# Patient Record
Sex: Male | Born: 1955 | Race: White | Hispanic: No | Marital: Single | Smoking: Former smoker
Health system: Southern US, Community
[De-identification: ages and names within clinical notes are randomized; demographics above are authoritative.]

## PROBLEM LIST (undated history)

## (undated) DIAGNOSIS — D126 Benign neoplasm of colon, unspecified: Secondary | ICD-10-CM

## (undated) HISTORY — PX: COLONOSCOPY: SHX174

## (undated) HISTORY — DX: Benign neoplasm of colon, unspecified: D12.6

---

## 2004-04-02 ENCOUNTER — Emergency Department (HOSPITAL_COMMUNITY): Admission: EM | Admit: 2004-04-02 | Discharge: 2004-04-02 | Payer: Self-pay | Admitting: *Deleted

## 2008-12-04 ENCOUNTER — Encounter
Admission: RE | Admit: 2008-12-04 | Discharge: 2008-12-04 | Payer: Self-pay | Admitting: Physical Medicine and Rehabilitation

## 2009-09-06 IMAGING — CR DG CHEST 2V
2 series · 2 of 2 positions shown · non-contrast
Comparison: None

CLINICAL DATA: Company vesicles.  Change in pulmonary function
tests.

CHEST - 2 VIEW

[view not recorded (1 of 2)]
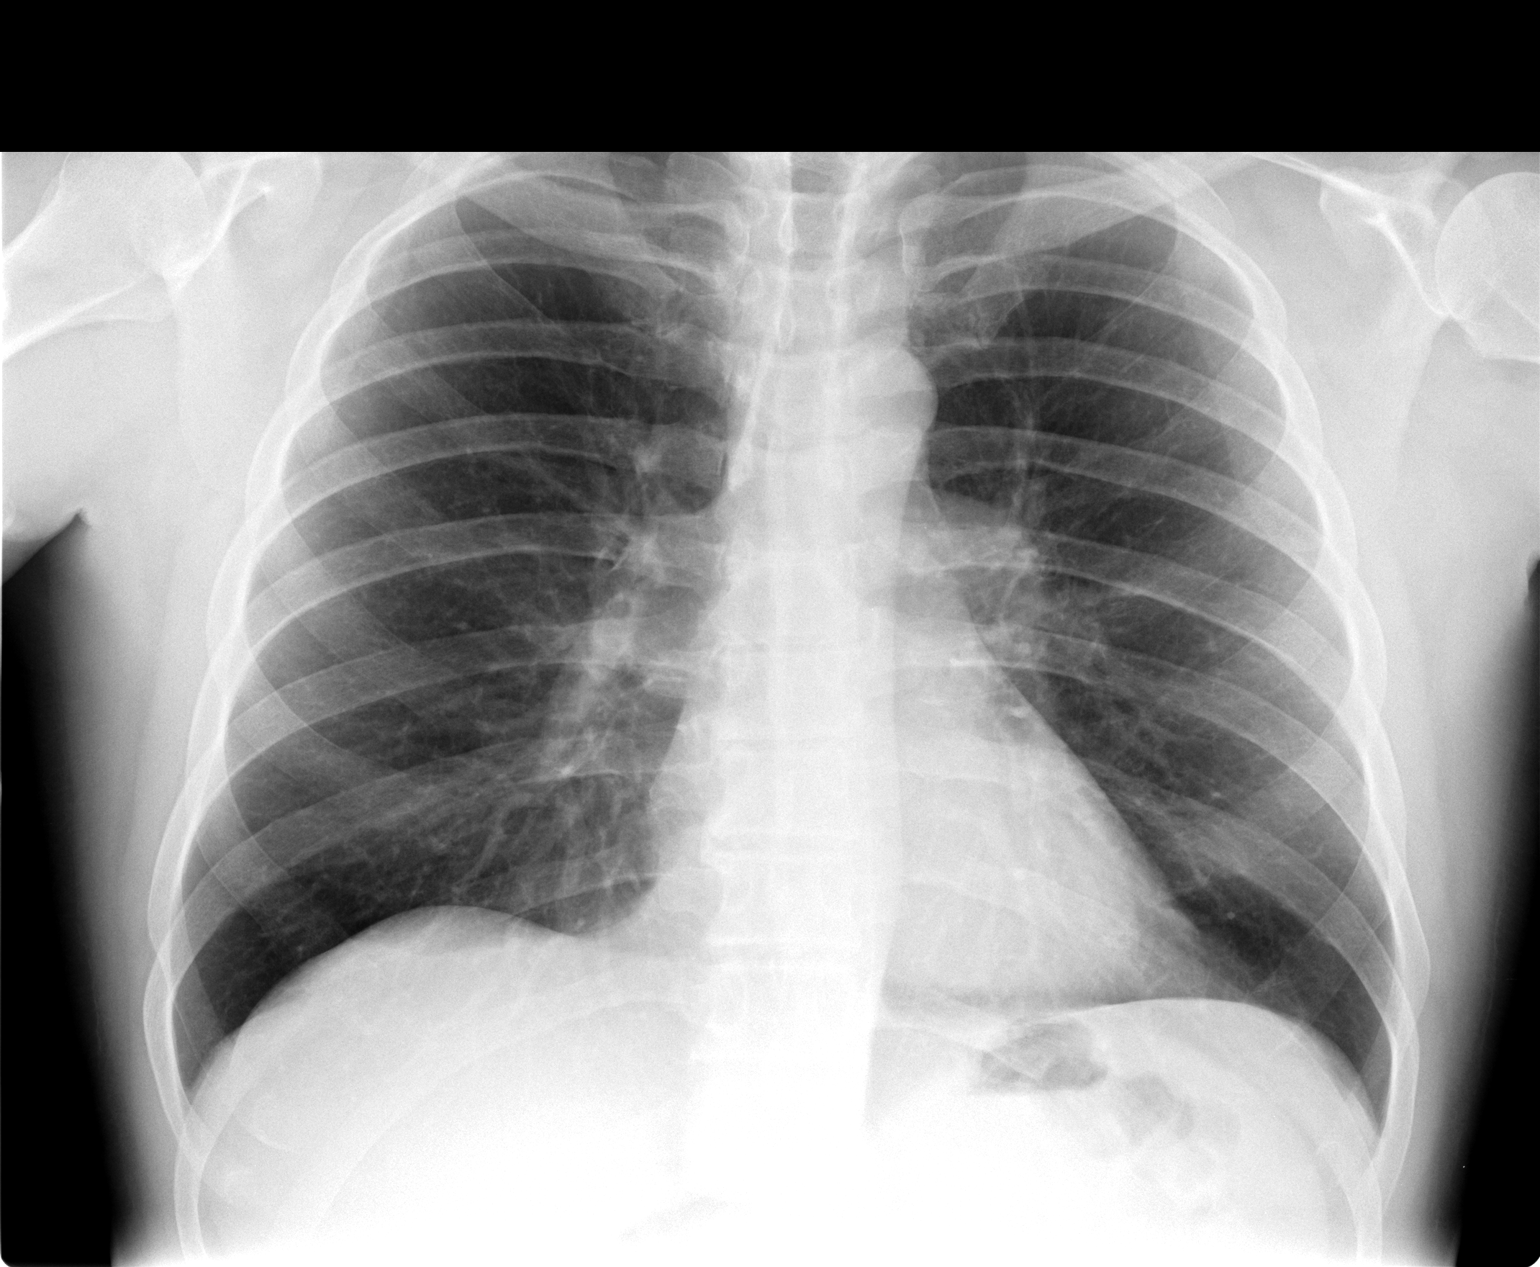

[view not recorded (2 of 2)]
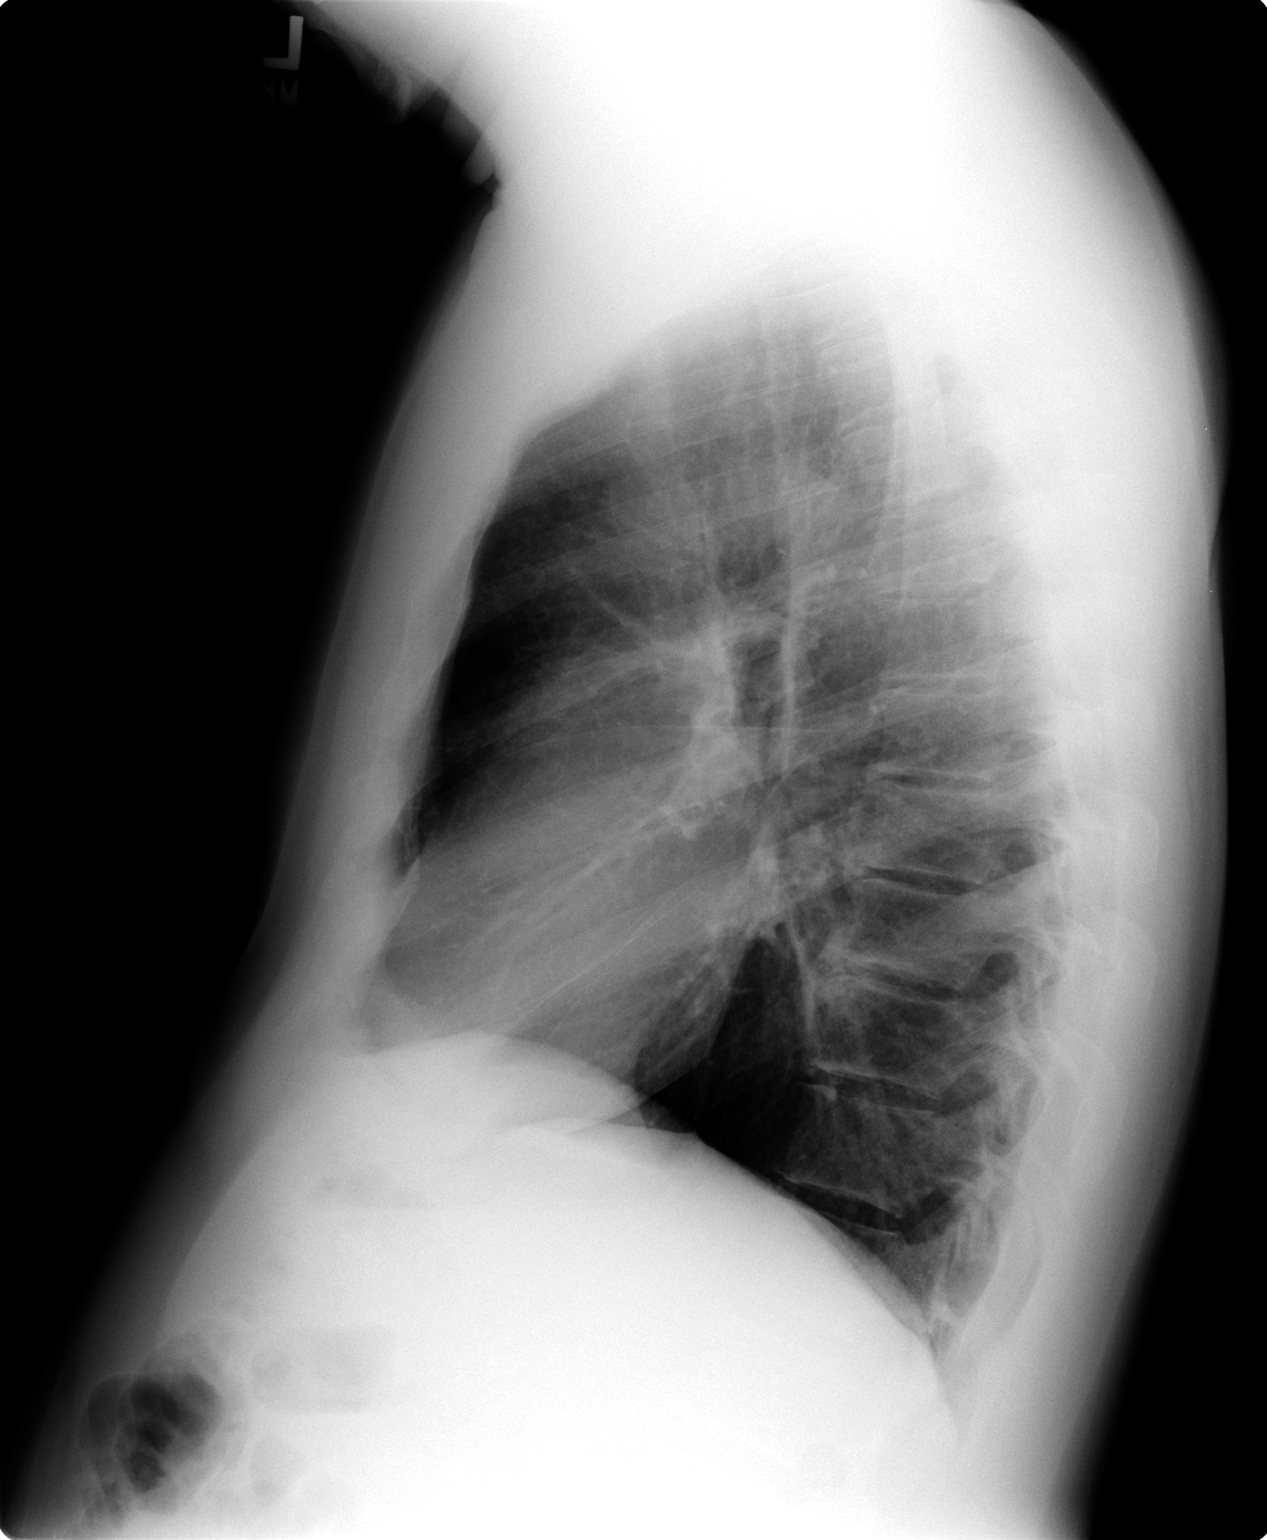

[2 of 2 positions shown; findings below may reference images not displayed]

FINDINGS: The cardiomediastinal silhouette is unremarkable.
The lungs are clear.
There is no evidence of focal airspace disease, pleural effusions,
or pneumothorax.
No acute bony abnormalities are identified.
IMPRESSION: No evidence of active cardiopulmonary disease.

REF:G3 DICTATED: 12/04/2008 [DATE]

## 2016-11-06 DIAGNOSIS — J028 Acute pharyngitis due to other specified organisms: Secondary | ICD-10-CM | POA: Diagnosis not present

## 2017-04-10 DIAGNOSIS — J189 Pneumonia, unspecified organism: Secondary | ICD-10-CM | POA: Diagnosis not present

## 2017-04-12 DIAGNOSIS — R05 Cough: Secondary | ICD-10-CM | POA: Diagnosis not present

## 2017-04-12 DIAGNOSIS — J40 Bronchitis, not specified as acute or chronic: Secondary | ICD-10-CM | POA: Diagnosis not present

## 2017-04-12 DIAGNOSIS — Z87891 Personal history of nicotine dependence: Secondary | ICD-10-CM | POA: Diagnosis not present

## 2017-04-12 DIAGNOSIS — R0602 Shortness of breath: Secondary | ICD-10-CM | POA: Diagnosis not present

## 2018-02-25 DIAGNOSIS — Z0000 Encounter for general adult medical examination without abnormal findings: Secondary | ICD-10-CM | POA: Diagnosis not present

## 2018-03-02 DIAGNOSIS — Z0000 Encounter for general adult medical examination without abnormal findings: Secondary | ICD-10-CM | POA: Diagnosis not present

## 2018-03-10 DIAGNOSIS — Z1211 Encounter for screening for malignant neoplasm of colon: Secondary | ICD-10-CM | POA: Diagnosis not present

## 2018-03-10 DIAGNOSIS — Z1212 Encounter for screening for malignant neoplasm of rectum: Secondary | ICD-10-CM | POA: Diagnosis not present

## 2018-03-24 DIAGNOSIS — R509 Fever, unspecified: Secondary | ICD-10-CM | POA: Diagnosis not present

## 2018-03-24 DIAGNOSIS — R05 Cough: Secondary | ICD-10-CM | POA: Diagnosis not present

## 2018-04-19 DIAGNOSIS — R195 Other fecal abnormalities: Secondary | ICD-10-CM | POA: Diagnosis not present

## 2018-05-27 DIAGNOSIS — R195 Other fecal abnormalities: Secondary | ICD-10-CM | POA: Diagnosis not present

## 2018-05-27 DIAGNOSIS — Z87442 Personal history of urinary calculi: Secondary | ICD-10-CM | POA: Diagnosis not present

## 2018-05-27 DIAGNOSIS — K635 Polyp of colon: Secondary | ICD-10-CM | POA: Diagnosis not present

## 2018-05-27 DIAGNOSIS — D126 Benign neoplasm of colon, unspecified: Secondary | ICD-10-CM | POA: Diagnosis not present

## 2018-06-14 DIAGNOSIS — D126 Benign neoplasm of colon, unspecified: Secondary | ICD-10-CM | POA: Diagnosis not present

## 2022-03-26 LAB — LIPID PANEL
LDL Cholesterol: 77
Triglycerides: 132 (ref 40–160)

## 2022-03-26 LAB — TSH: TSH: 46.1 — AB (ref 0.41–5.90)

## 2022-03-26 LAB — HEMOGLOBIN A1C: Hemoglobin A1C: 5.8

## 2022-03-26 LAB — VITAMIN D 25 HYDROXY (VIT D DEFICIENCY, FRACTURES): Vit D, 25-Hydroxy: 27

## 2022-05-13 ENCOUNTER — Encounter: Payer: Self-pay | Admitting: Nurse Practitioner

## 2022-05-13 ENCOUNTER — Ambulatory Visit: Payer: 59 | Admitting: Nurse Practitioner

## 2022-05-13 VITALS — BP 117/77 | HR 69 | Ht 66.0 in | Wt 177.0 lb

## 2022-05-13 DIAGNOSIS — R7989 Other specified abnormal findings of blood chemistry: Secondary | ICD-10-CM | POA: Diagnosis not present

## 2022-05-13 DIAGNOSIS — E041 Nontoxic single thyroid nodule: Secondary | ICD-10-CM | POA: Diagnosis not present

## 2022-05-13 DIAGNOSIS — E039 Hypothyroidism, unspecified: Secondary | ICD-10-CM

## 2022-05-13 NOTE — Patient Instructions (Signed)
Thyroid-Stimulating Hormone Test Why am I having this test? The thyroid is a gland in the lower front of the neck. It makes hormones that affect many body parts and systems, including the system that affects how quickly the body burns fuel for energy (metabolism). The pituitary gland is located just below the brain, behind the eyes and nasal passages. It helps maintain thyroid hormone levels and thyroid gland function. You may have a thyroid-stimulating hormone (TSH) test if you have possible symptoms of abnormal thyroid hormone levels. This test can help your health care provider: Diagnose a disorder of the thyroid gland or pituitary gland. Manage your condition and treatment if you have an underactive thyroid (hypothyroidism) or an overactive thyroid (hyperthyroidism). Newborn babies may have this test done to screen for hypothyroidism that is present at birth (congenital). What is being tested? This test measures the amount of TSH in your blood. TSH may also be called thyrotropin. When the thyroid does not make enough hormones, the pituitary gland releases TSH into the bloodstream to stimulate the thyroid gland to make more hormones. What kind of sample is taken?     A blood sample is required for this test. It is usually collected by inserting a needle into a blood vessel. For newborns, a small amount of blood may be collected from the umbilical cord, or by using a small needle to prick the baby's heel (heel stick). Tell a health care provider about: All medicines you are taking, including vitamins, herbs, eye drops, creams, and over-the-counter medicines. Any bleeding problems you have. Any surgeries you have had. Any medical conditions you have. Whether you are pregnant or may be pregnant. How are the results reported? Your test results will be reported as a value that indicates how much TSH is in your blood. Your health care provider will compare your results to normal ranges that were  established after testing a large group of people (reference ranges). Reference ranges may vary among labs and hospitals. For this test, common reference ranges are: Adult: 2-10 microunits/mL or 2-10 milliunits/L. Newborn: Heel stick: 3-18 microunits/mL or 3-18 milliunits/L. Umbilical cord: 3-12 microunits/mL or 3-12 milliunits/L. What do the results mean? Results that are within the reference range are considered normal. This means that you have a normal amount of TSH in your blood. Results that are higher than the reference range mean that your TSH levels are too high. This may mean: Your thyroid gland is not making enough thyroid hormones. Your thyroid medicine dosage is too low. You have a tumor on your pituitary gland. This is rare. Results that are lower than the reference range mean that your TSH levels are too low. This may be caused by hyperthyroidism or by a problem with the pituitary gland function. Talk with your health care provider about what your results mean. Questions to ask your health care provider Ask your health care provider, or the department that is doing the test: When will my results be ready? How will I get my results? What are my treatment options? What other tests do I need? What are my next steps? Summary You may have a thyroid-stimulating hormone (TSH) test if you have possible symptoms of abnormal thyroid hormone levels. The thyroid is a gland in the lower front of the neck. It makes hormones that affect many body parts and systems. The pituitary gland is located just below the brain, behind the eyes and nasal passages. It helps maintain thyroid hormone levels and thyroid gland function. This test   measures the amount of TSH in your blood. TSH is made by the pituitary gland. It may also be called thyrotropin. This information is not intended to replace advice given to you by your health care provider. Make sure you discuss any questions you have with your  health care provider. Document Revised: 10/15/2021 Document Reviewed: 10/15/2021 Elsevier Patient Education  2023 Elsevier Inc.  

## 2022-05-13 NOTE — Progress Notes (Signed)
Endocrinology Consult Note                                         05/13/2022, 4:19 PM  Subjective:   Subjective    Lonnie Wade is a 66 y.o.-year-old male patient being seen in consultation for hypothyroidism referred by [place], MD.   Past Medical History:  Diagnosis Date   Tubular adenoma of colon     Past Surgical History:  Procedure Laterality Date   COLONOSCOPY      Social History   Socioeconomic History   Marital status: Single    Spouse name: Not on file   Number of children: Not on file   Years of education: Not on file   Highest education level: Not on file  Occupational History   Not on file  Tobacco Use   Smoking status: Former    Types: Cigarettes    Quit date: 07/27/2009    Years since quitting: 12.8   Smokeless tobacco: Never  Vaping Use   Vaping Use: Never used  Substance and Sexual Activity   Alcohol use: Never   Drug use: Never   Sexual activity: Not on file  Other Topics Concern   Not on file  Social History Narrative   Not on file   Social Determinants of Health   Financial Resource Strain: Not on file  Food Insecurity: Not on file  Transportation Needs: Not on file  Physical Activity: Not on file  Stress: Not on file  Social Connections: Not on file    Family History  Problem Relation Age of Onset   Hypertension Father    Diabetes Father     Outpatient Encounter Medications as of 05/13/2022  Medication Sig   Cholecalciferol (VITAMIN D3) 25 MCG (1000 UT) CAPS Take by mouth daily.   levothyroxine (SYNTHROID) 75 MCG tablet Take 75 mcg by mouth daily.   rosuvastatin (CRESTOR) 10 MG tablet Take 10 mg by mouth daily.   No facility-administered encounter medications on file as of 05/13/2022.    ALLERGIES: No Known Allergies VACCINATION STATUS:  There is no immunization history on file for this patient.   HPI   Lonnie Wade  is a patient with the  above medical history. he was diagnosed  with hypothyroidism at approximate age of 10 years (recent diagnosis after routine labs showed abnormal TSH), which required subsequent initiation of thyroid hormone replacement. he was given various doses of Levothyroxine, currently on 75 micrograms. he reports compliance to this medication:  Taking it daily on empty stomach with water, separated by >30 minutes before breakfast and other medications, and by at least 4 hours from calcium, iron, PPIs, multivitamins .  I reviewed patient's thyroid tests:  Lab Results  Component Value Date   TSH 46.10 (A) 03/26/2022     Pt describes: - cold intolerance  Pt denies feeling nodules in neck, hoarseness, dysphagia/odynophagia, SOB with lying down.  he does have family history of thyroid disorders in his daughter and  wife.  No family history of thyroid cancer. No history of radiation therapy to head or neck.  No recent use of iodine supplements.  Denies use of Biotin containing supplements.  He had thyroid US on 04/10/22 which showed 5m nodule in left thyroid lobe, not suspicious for malignancy, not recommending follow up.   Review of systems  Constitutional: + Minimally fluctuating body weight,  current Body mass index is 28.57 kg/m. , no fatigue, no subjective hyperthermia, no subjective hypothermia Eyes: no blurry vision, no xerophthalmia ENT: no sore throat, no nodules palpated in throat, no dysphagia/odynophagia, no hoarseness Cardiovascular: no chest pain, no shortness of breath, no palpitations, no leg swelling Respiratory: no cough, no shortness of breath Gastrointestinal: no nausea/vomiting/diarrhea Musculoskeletal: no muscle/joint aches Skin: no rashes, no hyperemia Neurological: no tremors, no numbness, no tingling, no dizziness Psychiatric: no depression, no anxiety   Objective:   Objective     BP 117/77   Pulse 69   Ht '5\' 6"'$  (1.676 m)   Wt 177 lb (80.3 kg)   BMI 28.57 kg/m  Wt  Readings from Last 3 Encounters:  05/13/22 177 lb (80.3 kg)    BP Readings from Last 3 Encounters:  05/13/22 117/77     Constitutional:  Body mass index is 28.57 kg/m., not in acute distress, normal state of mind Eyes: PERRLA, EOMI, no exophthalmos ENT: moist mucous membranes, no thyromegaly, no cervical lymphadenopathy Cardiovascular: normal precordial activity, RRR, no murmur/rubs/gallops Respiratory:  adequate breathing efforts, no gross chest deformity, Clear to auscultation bilaterally Gastrointestinal: abdomen soft, non-tender, no distension, bowel sounds present Musculoskeletal: no gross deformities, strength intact in all four extremities Skin: moist, warm, no rashes Neurological: no tremor with outstretched hands, deep tendon reflexes normal in BLE.   CMP ( most recent) CMP  No results found for: "NA", "K", "CL", "CO2", "GLUCOSE", "BUN", "CREATININE", "CALCIUM", "PROT", "ALBUMIN", "AST", "ALT", "ALKPHOS", "BILITOT", "GFRNONAA", "GFRAA"   Diabetic Labs (most recent): Lab Results  Component Value Date   HGBA1C 5.8 03/26/2022     Lipid Panel ( most recent) Lipid Panel     Component Value Date/Time   TRIG 132 03/26/2022 0000   LDLCALC 77 03/26/2022 0000       Lab Results  Component Value Date   TSH 46.10 (A) 03/26/2022    Thyroid [facility] 04/10/22 Clinical history: elevated TSH  Findings: Right lobe: measures 6 x 2.1 x 2.1 cm.  Heterogeneous echotexture. Normal flow.  No nodules. Left lobe: Measures 5.9 x 2.7 x 3 cm.  Heterogeneous echotexture.  Normal Flow.  5 x 6 x 4 mm hyperechoic nodule TR3. Isthmus: 483m no nodules No adenopathy Comparison: None  Impression:  6 mm left lobe nodule, TR3, no follow up needed No other nodules.  Assessment & Plan:   ASSESSMENT / PLAN:  1. Hypothyroidism-unspecified   Patient with relatively new hypothyroidism, on levothyroxine therapy. On physical exam , patient  does not have gross goiter, thyroid nodules, or neck  compression symptoms. He is advised to continue his current dose of Levothyroxine 75 mcg po daily before breakfast (was recently increased to this about a week ago).   - We discussed about correct intake of levothyroxine, at fasting, with water, separated by at least 30 minutes from breakfast, and separated by more than 4 hours from calcium, iron, multivitamins, acid reflux medications (PPIs). -Patient is made aware of the fact that thyroid hormone replacement is needed for life, dose to be adjusted by periodic monitoring of thyroid function tests.  -  Will check thyroid tests before next visit: TSH, free T4 and antibody testing to help classify his dysfunction.    - Time spent with the patient: 45 minutes, of which >50% was spent in obtaining information about his symptoms, reviewing his previous labs, evaluations, and treatments, counseling him about his hypothyroidism, and developing a plan to confirm the diagnosis and long term treatment as necessary. Please refer to "Patient Self Inventory" in the Media tab for reviewed elements of pertinent patient history.  Mercy Riding Leija participated in the discussions, expressed understanding, and voiced agreement with the above plans.  All questions were answered to his satisfaction. he is encouraged to contact clinic should he have any questions or concerns prior to his return visit.   FOLLOW UP PLAN:  Return in about 6 weeks (around 06/24/2022) for Thyroid follow up, Previsit labs.  Rayetta Pigg, [facility] [address] [place], [place] [address] Phone: [phone] Fax: [phone]  05/13/2022, 4:19 PM

## 2022-07-02 LAB — TSH: TSH: 4.17 (ref 0.41–5.90)

## 2022-07-07 ENCOUNTER — Ambulatory Visit: Payer: 59 | Admitting: Nurse Practitioner

## 2022-07-07 ENCOUNTER — Encounter: Payer: Self-pay | Admitting: Nurse Practitioner

## 2022-07-07 VITALS — BP 112/72 | HR 83 | Ht 66.0 in | Wt 172.2 lb

## 2022-07-07 DIAGNOSIS — R7989 Other specified abnormal findings of blood chemistry: Secondary | ICD-10-CM | POA: Diagnosis not present

## 2022-07-07 DIAGNOSIS — E039 Hypothyroidism, unspecified: Secondary | ICD-10-CM | POA: Diagnosis not present

## 2022-07-07 MED ORDER — LEVOTHYROXINE SODIUM 88 MCG PO TABS: 88.0000 ug | ORAL_TABLET | Freq: Every day | ORAL | 1 refills | Status: DC

## 2022-07-07 NOTE — Patient Instructions (Signed)

## 2022-07-07 NOTE — Progress Notes (Signed)
Endocrinology Follow Up Note                                         07/07/2022, 2:40 PM  Subjective:   Subjective    Lonnie Wade is a 66 y.o.-year-old male patient being seen in follow up after being seen in consultation for hypothyroidism referred by [place], MD.   Past Medical History:  Diagnosis Date   Tubular adenoma of colon     Past Surgical History:  Procedure Laterality Date   COLONOSCOPY      Social History   Socioeconomic History   Marital status: Single    Spouse name: Not on file   Number of children: Not on file   Years of education: Not on file   Highest education level: Not on file  Occupational History   Not on file  Tobacco Use   Smoking status: Former    Types: Cigarettes    Quit date: 07/27/2009    Years since quitting: 12.9   Smokeless tobacco: Never  Vaping Use   Vaping Use: Never used  Substance and Sexual Activity   Alcohol use: Never   Drug use: Never   Sexual activity: Not on file  Other Topics Concern   Not on file  Social History Narrative   Not on file   Social Determinants of Health   Financial Resource Strain: Not on file  Food Insecurity: Not on file  Transportation Needs: Not on file  Physical Activity: Not on file  Stress: Not on file  Social Connections: Not on file    Family History  Problem Relation Age of Onset   Hypertension Father    Diabetes Father     Outpatient Encounter Medications as of 07/07/2022  Medication Sig   Cholecalciferol (VITAMIN D3) 25 MCG (1000 UT) CAPS Take by mouth daily.   rosuvastatin (CRESTOR) 10 MG tablet Take 10 mg by mouth daily.   [DISCONTINUED] levothyroxine (SYNTHROID) 75 MCG tablet Take 88 mcg by mouth daily.   levothyroxine (SYNTHROID) 88 MCG tablet Take 1 tablet (88 mcg total) by mouth daily before breakfast.   No facility-administered encounter medications on file as of 07/07/2022.     ALLERGIES: No Known Allergies VACCINATION STATUS:  There is no immunization history on file for this patient.   HPI   Lonnie Wade  is a patient with the above medical history. he was diagnosed  with hypothyroidism at approximate age of 66 years (recent diagnosis after routine labs showed abnormal TSH), which required subsequent initiation of thyroid hormone replacement. he was given various doses of Levothyroxine, currently on 75 micrograms. he reports compliance to this medication:  Taking it daily on empty stomach with water, separated by >30 minutes before breakfast and other medications, and by at least 4 hours from calcium, iron, PPIs, multivitamins .  I reviewed patient's thyroid tests:  Lab Results  Component Value Date   TSH 4.17 07/02/2022   TSH 46.10 (A) 03/26/2022  Pt denies feeling nodules in neck, hoarseness, dysphagia/odynophagia, SOB with lying down.  he does have family history of thyroid disorders in his daughter and wife.  No family history of thyroid cancer. No history of radiation therapy to head or neck.  No recent use of iodine supplements.  Denies use of Biotin containing supplements.  He had thyroid US on 04/10/22 which showed 32m nodule in left thyroid lobe, not suspicious for malignancy, not recommending follow up.   Review of systems  Constitutional: + Minimally fluctuating body weight,  current Body mass index is 27.79 kg/m. , no fatigue, no subjective hyperthermia, no subjective hypothermia Eyes: no blurry vision, no xerophthalmia ENT: no sore throat, no nodules palpated in throat, no dysphagia/odynophagia, no hoarseness Cardiovascular: no chest pain, no shortness of breath, no palpitations, no leg swelling Respiratory: no cough, no shortness of breath Gastrointestinal: no nausea/vomiting/diarrhea Musculoskeletal: no muscle/joint aches Skin: no rashes, no hyperemia Neurological: no tremors, no numbness, no tingling, no dizziness Psychiatric:  no depression, no anxiety   Objective:   Objective     BP 112/72 (BP Location: Left Arm, Patient Position: Sitting, Cuff Size: Normal)   Pulse 83   Ht '5\' 6"'$  (1.676 m)   Wt 172 lb 3.2 oz (78.1 kg)   BMI 27.79 kg/m  Wt Readings from Last 3 Encounters:  07/07/22 172 lb 3.2 oz (78.1 kg)  05/13/22 177 lb (80.3 kg)    BP Readings from Last 3 Encounters:  07/07/22 112/72  05/13/22 117/77      Physical Exam- Limited  Constitutional:  Body mass index is 27.79 kg/m. , not in acute distress, normal state of mind Eyes:  EOMI, no exophthalmos Neck: Supple Cardiovascular: RRR, no murmurs, rubs, or gallops, no edema Respiratory: Adequate breathing efforts, no crackles, rales, rhonchi, or wheezing Musculoskeletal: no gross deformities, strength intact in all four extremities, no gross restriction of joint movements Skin:  no rashes, no hyperemia Neurological: no tremor with outstretched hands   CMP ( most recent) CMP  No results found for: "NA", "K", "CL", "CO2", "GLUCOSE", "BUN", "CREATININE", "CALCIUM", "PROT", "ALBUMIN", "AST", "ALT", "ALKPHOS", "BILITOT", "GFRNONAA", "GFRAA"   Diabetic Labs (most recent): Lab Results  Component Value Date   HGBA1C 5.8 03/26/2022     Lipid Panel ( most recent) Lipid Panel     Component Value Date/Time   TRIG 132 03/26/2022 0000   LDLCALC 77 03/26/2022 0000       Lab Results  Component Value Date   TSH 4.17 07/02/2022   TSH 46.10 (A) 03/26/2022    Thyroid [facility] 04/10/22 Clinical history: elevated TSH  Findings: Right lobe: measures 6 x 2.1 x 2.1 cm.  Heterogeneous echotexture. Normal flow.  No nodules. Left lobe: Measures 5.9 x 2.7 x 3 cm.  Heterogeneous echotexture.  Normal Flow.  5 x 6 x 4 mm hyperechoic nodule TR3. Isthmus: 460m no nodules No adenopathy Comparison: None  Impression:  6 mm left lobe nodule, TR3, no follow up needed No other nodules.  Assessment & Plan:   ASSESSMENT / PLAN:  1.  Hypothyroidism-unspecified   Patient with relatively new hypothyroidism, on levothyroxine therapy. On physical exam , patient  does not have gross goiter, thyroid nodules, or neck compression symptoms.  His previsit thyroid function tests are consistent with slight under-replacement, much better than before.  He is advised to increase his Levothyroxine to 88 mcg po daily before breakfast.  - We discussed about correct intake of levothyroxine, at fasting, with water, separated by at  least 30 minutes from breakfast, and separated by more than 4 hours from calcium, iron, multivitamins, acid reflux medications (PPIs). -Patient is made aware of the fact that thyroid hormone replacement is needed for life, dose to be adjusted by periodic monitoring of thyroid function tests.  - Will check thyroid tests before next visit: TSH, free T4 and antibody testing to help classify his dysfunction (this was not performed with last blood draw).     I spent 20 minutes in the care of the patient today including review of labs from Thyroid Function, CMP, and other relevant labs ; imaging/biopsy records (current and previous including abstractions from other facilities); face-to-face time discussing  his lab results and symptoms, medications doses, his options of short and long term treatment based on the latest standards of care / guidelines;   and documenting the encounter.  Mercy Riding Mackintosh  participated in the discussions, expressed understanding, and voiced agreement with the above plans.  All questions were answered to his satisfaction. he is encouraged to contact clinic should he have any questions or concerns prior to his return visit.   FOLLOW UP PLAN:  Return in about 3 months (around 10/06/2022) for Thyroid follow up, Previsit labs.  Rayetta Pigg, [facility] [address] [place], [place] [address] Phone: [phone] Fax: [phone]  07/07/2022, 2:40 PM

## 2022-10-07 ENCOUNTER — Ambulatory Visit: Payer: 59 | Admitting: Nurse Practitioner

## 2022-10-07 ENCOUNTER — Encounter: Payer: Self-pay | Admitting: Nurse Practitioner

## 2022-10-07 VITALS — BP 101/66 | HR 60 | Ht 66.0 in | Wt 173.0 lb

## 2022-10-07 DIAGNOSIS — E041 Nontoxic single thyroid nodule: Secondary | ICD-10-CM

## 2022-10-07 DIAGNOSIS — E038 Other specified hypothyroidism: Secondary | ICD-10-CM | POA: Diagnosis not present

## 2022-10-07 DIAGNOSIS — E063 Autoimmune thyroiditis: Secondary | ICD-10-CM

## 2022-10-07 MED ORDER — LEVOTHYROXINE SODIUM 88 MCG PO TABS: 88.0000 ug | ORAL_TABLET | Freq: Every day | ORAL | 1 refills | Status: DC

## 2022-10-07 NOTE — Progress Notes (Signed)
Endocrinology Follow Up Note                                         10/07/2022, 8:25 AM  Subjective:   Subjective    Lonnie Wade is a 66 y.o.-year-old male patient being seen in follow up after being seen in consultation for hypothyroidism referred by Baxter Nation, MD.   Past Medical History:  Diagnosis Date   Tubular adenoma of colon     Past Surgical History:  Procedure Laterality Date   COLONOSCOPY      Social History   Socioeconomic History   Marital status: Single    Spouse name: Not on file   Number of children: Not on file   Years of education: Not on file   Highest education level: Not on file  Occupational History   Not on file  Tobacco Use   Smoking status: Former    Types: Cigarettes    Quit date: 07/27/2009    Years since quitting: 13.2   Smokeless tobacco: Never  Vaping Use   Vaping Use: Never used  Substance and Sexual Activity   Alcohol use: Never   Drug use: Never   Sexual activity: Not on file  Other Topics Concern   Not on file  Social History Narrative   Not on file   Social Determinants of Health   Financial Resource Strain: Not on file  Food Insecurity: Not on file  Transportation Needs: Not on file  Physical Activity: Not on file  Stress: Not on file  Social Connections: Not on file    Family History  Problem Relation Age of Onset   Hypertension Father    Diabetes Father     Outpatient Encounter Medications as of 10/07/2022  Medication Sig   Cholecalciferol (VITAMIN D3) 25 MCG (1000 UT) CAPS Take by mouth daily.   rosuvastatin (CRESTOR) 10 MG tablet Take 10 mg by mouth daily.   [DISCONTINUED] levothyroxine (SYNTHROID) 88 MCG tablet Take 1 tablet (88 mcg total) by mouth daily before breakfast.   levothyroxine (SYNTHROID) 88 MCG tablet Take 1 tablet (88 mcg total) by mouth daily before breakfast.   No facility-administered encounter medications on file  as of 10/07/2022.    ALLERGIES: No Known Allergies VACCINATION STATUS:  There is no immunization history on file for this patient.   HPI   Lonnie Wade  is a patient with the above medical history. he was diagnosed  with hypothyroidism at approximate age of 57 years (recent diagnosis after routine labs showed abnormal TSH), which required subsequent initiation of thyroid hormone replacement. he was given various doses of Levothyroxine, currently on 88 micrograms. he reports compliance to this medication:  Taking it daily on empty stomach with water, separated by >30 minutes before breakfast and other medications, and by at least 4 hours from calcium, iron, PPIs, multivitamins .  I reviewed patient's thyroid tests:  Lab Results  Component Value Date   TSH 4.17 07/02/2022   TSH 46.10 (A)  03/26/2022     Pt denies feeling nodules in neck, hoarseness, dysphagia/odynophagia, SOB with lying down.  he does have family history of thyroid disorders in his daughter and wife.  No family history of thyroid cancer. No history of radiation therapy to head or neck.  No recent use of iodine supplements.  Denies use of Biotin containing supplements.  He had thyroid US on 04/10/22 which showed 44m nodule in left thyroid lobe, not suspicious for malignancy, not recommending follow up.   Review of systems  Constitutional: + Minimally fluctuating body weight,  current Body mass index is 27.92 kg/m. , no fatigue, no subjective hyperthermia, no subjective hypothermia Eyes: no blurry vision, no xerophthalmia ENT: no sore throat, no nodules palpated in throat, no dysphagia/odynophagia, no hoarseness Cardiovascular: no chest pain, no shortness of breath, no palpitations, no leg swelling Respiratory: no cough, no shortness of breath Gastrointestinal: no nausea/vomiting/diarrhea Musculoskeletal: no muscle/joint aches Skin: no rashes, no hyperemia Neurological: no tremors, no numbness, no tingling, no  dizziness Psychiatric: no depression, no anxiety   Objective:   Objective     BP 101/66 (BP Location: Left Arm, Patient Position: Sitting, Cuff Size: Normal)   Pulse 60   Ht '5\' 6"'$  (1.676 m)   Wt 173 lb (78.5 kg)   BMI 27.92 kg/m  Wt Readings from Last 3 Encounters:  10/07/22 173 lb (78.5 kg)  07/07/22 172 lb 3.2 oz (78.1 kg)  05/13/22 177 lb (80.3 kg)    BP Readings from Last 3 Encounters:  10/07/22 101/66  07/07/22 112/72  05/13/22 117/77      Physical Exam- Limited  Constitutional:  Body mass index is 27.92 kg/m. , not in acute distress, normal state of mind Eyes:  EOMI, no exophthalmos Musculoskeletal: no gross deformities, strength intact in all four extremities, no gross restriction of joint movements Skin:  no rashes, no hyperemia Neurological: no tremor with outstretched hands   CMP ( most recent) CMP  No results found for: "NA", "K", "CL", "CO2", "GLUCOSE", "BUN", "CREATININE", "CALCIUM", "PROT", "ALBUMIN", "AST", "ALT", "ALKPHOS", "BILITOT", "GFRNONAA", "GFRAA"   Diabetic Labs (most recent): Lab Results  Component Value Date   HGBA1C 5.8 03/26/2022     Lipid Panel ( most recent) Lipid Panel     Component Value Date/Time   TRIG 132 03/26/2022 0000   LDLCALC 77 03/26/2022 0000       Lab Results  Component Value Date   TSH 4.17 07/02/2022   TSH 46.10 (A) 03/26/2022    Thyroid [facility] 04/10/22 Clinical history: elevated TSH  Findings: Right lobe: measures 6 x 2.1 x 2.1 cm.  Heterogeneous echotexture. Normal flow.  No nodules. Left lobe: Measures 5.9 x 2.7 x 3 cm.  Heterogeneous echotexture.  Normal Flow.  5 x 6 x 4 mm hyperechoic nodule TR3. Isthmus: 455m no nodules No adenopathy Comparison: None  Impression:  6 mm left lobe nodule, TR3, no follow up needed No other nodules.    Assessment & Plan:   ASSESSMENT / PLAN:  1. Hypothyroidism-r/t Hashimotos thyroiditis   Patient with relatively new hypothyroidism, on levothyroxine  therapy. 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  His previsit TFTs are consistent with appropriate hormone replacement.  He is advised to continue his Levothyroxine 88 mcg po daily before breakfast.  - We discussed about correct intake of levothyroxine, at fasting, with water, separated by at least 30 minutes from breakfast, and separated by more than 4 hours from calcium, iron,  multivitamins, acid reflux medications (PPIs). -Patient is made aware of the fact that thyroid hormone replacement is needed for life, dose to be adjusted by periodic monitoring of thyroid function tests.     I spent 20 minutes in the care of the patient today including review of labs from Thyroid Function, CMP, and other relevant labs ; imaging/biopsy records (current and previous including abstractions from other facilities); face-to-face time discussing  his lab results and symptoms, medications doses, his options of short and long term treatment based on the latest standards of care / guidelines;   and documenting the encounter.  Mercy Riding Sikora  participated in the discussions, expressed understanding, and voiced agreement with the above plans.  All questions were answered to his satisfaction. he is encouraged to contact clinic should he have any questions or concerns prior to his return visit.   FOLLOW UP PLAN:  Return in about 4 months (around 02/06/2023) for Thyroid follow up, Previsit labs.  Rayetta Pigg, [facility] [address] [place], [place] [address] Phone: [phone] Fax: [phone]  10/07/2022, 8:25 AM

## 2022-10-30 ENCOUNTER — Ambulatory Visit: Payer: Medicare Other | Admitting: Urology

## 2022-10-30 ENCOUNTER — Encounter: Payer: Self-pay | Admitting: Urology

## 2022-10-30 VITALS — BP 123/73 | HR 81

## 2022-10-30 DIAGNOSIS — R3129 Other microscopic hematuria: Secondary | ICD-10-CM

## 2022-10-30 DIAGNOSIS — Z87442 Personal history of urinary calculi: Secondary | ICD-10-CM

## 2022-10-30 DIAGNOSIS — Z125 Encounter for screening for malignant neoplasm of prostate: Secondary | ICD-10-CM | POA: Diagnosis not present

## 2022-10-30 LAB — MICROSCOPIC EXAMINATION
Bacteria, UA: NONE SEEN
Epithelial Cells (non renal): NONE SEEN /hpf (ref 0–10)
WBC, UA: NONE SEEN /hpf (ref 0–5)

## 2022-10-30 LAB — URINALYSIS, ROUTINE W REFLEX MICROSCOPIC
Bilirubin, UA: NEGATIVE
Glucose, UA: NEGATIVE
Leukocytes,UA: NEGATIVE
Nitrite, UA: NEGATIVE
Protein,UA: NEGATIVE
Specific Gravity, UA: 1.025 (ref 1.005–1.030)
Urobilinogen, Ur: 0.2 mg/dL (ref 0.2–1.0)
pH, UA: 5 (ref 5.0–7.5)

## 2022-10-30 NOTE — Progress Notes (Signed)
Subjective: 1. Microhematuria   2. History of nephrolithiasis   3. Screening for prostate cancer      Consult requested by Dr. Wynelle Link is a 67 yo WM who is sent for microhematuria on UA's over the last 3-4 months.  He has not had any gross hematuria or prior issues.  He had a stone on 12/10/2013 that he passed.  He has had rare UTI's but no GU surgery.  He has some intermittent left lower rib pain that is associated with sitting.   He has some dysuria 1-2x a months.  He was treated for a UTI and yeast infection.  He is circumcised.   He has no nocturia, urgency or frequency.  He can have a split stream at times.  UA today has 2+ blood.  ROS:  Review of Systems  All other systems reviewed and are negative.   No Known Allergies  Past Medical History:  Diagnosis Date   Tubular adenoma of colon     Past Surgical History:  Procedure Laterality Date   COLONOSCOPY      Social History   Socioeconomic History   Marital status: Single    Spouse name: Not on file   Number of children: Not on file   Years of education: Not on file   Highest education level: Not on file  Occupational History   Not on file  Tobacco Use   Smoking status: Former    Types: Cigarettes    Quit date: 07/27/2009    Years since quitting: 13.2   Smokeless tobacco: Never  Vaping Use   Vaping Use: Never used  Substance and Sexual Activity   Alcohol use: Never   Drug use: Never   Sexual activity: Not on file  Other Topics Concern   Not on file  Social History Narrative   Not on file   Social Determinants of Health   Financial Resource Strain: Not on file  Food Insecurity: Not on file  Transportation Needs: Not on file  Physical Activity: Not on file  Stress: Not on file  Social Connections: Not on file  Intimate Partner Violence: Not on file    Family History  Problem Relation Age of Onset   Hypertension Father    Diabetes Father     Anti-infectives: Anti-infectives (From  admission, onward)    None       Current Outpatient Medications  Medication Sig Dispense Refill   Cholecalciferol (VITAMIN D3) 25 MCG (1000 UT) CAPS Take by mouth daily.     levothyroxine (SYNTHROID) 88 MCG tablet Take 1 tablet (88 mcg total) by mouth daily before breakfast. 90 tablet 1   rosuvastatin (CRESTOR) 10 MG tablet Take 10 mg by mouth daily.     No current facility-administered medications for this visit.     Objective: Vital signs in last 24 hours: BP 123/73   Pulse 81   Intake/Output from previous day: No intake/output data recorded. Intake/Output this shift: '@IOTHISSHIFT'$ @   Physical Exam Vitals reviewed.  Constitutional:      Appearance: Normal appearance.  Cardiovascular:     Rate and Rhythm: Normal rate and regular rhythm.     Heart sounds: Normal heart sounds.  Pulmonary:     Effort: Pulmonary effort is normal. No respiratory distress.     Breath sounds: Normal breath sounds.  Abdominal:     General: Abdomen is flat.     Palpations: Abdomen is soft.     Tenderness: There is no abdominal tenderness.  Genitourinary:    Comments: Circ phallus with adequate meatus.  Scaly skin on the dorsal glans. Scrotum, testes and epididymis normal. AP/NST without mass or lesions. Prostate 1.5+ benign. SV non-palpable.  Musculoskeletal:        General: Normal range of motion.     Cervical back: Normal range of motion and neck supple.  Skin:    General: Skin is warm and dry.  Neurological:     General: No focal deficit present.     Mental Status: He is alert and oriented to person, place, and time.  Psychiatric:        Mood and Affect: Mood normal.        Behavior: Behavior normal.     Lab Results:  Results for orders placed or performed in visit on 10/30/22 (from the past 24 hour(s))  Urinalysis, Routine w reflex microscopic     Status: Abnormal   Collection Time: 10/30/22 10:36 AM  Result Value Ref Range   Specific Gravity, UA 1.025 1.005 - 1.030    pH, UA 5.0 5.0 - 7.5   Color, UA Yellow Yellow   Appearance Ur Clear Clear   Leukocytes,UA Negative Negative   Protein,UA Negative Negative/Trace   Glucose, UA Negative Negative   Ketones, UA Trace (A) Negative   RBC, UA 2+ (A) Negative   Bilirubin, UA Negative Negative   Urobilinogen, Ur 0.2 0.2 - 1.0 mg/dL   Nitrite, UA Negative Negative   Microscopic Examination See below:    Narrative   Performed at:  [facility] [address] [place], [place]  [id number] Lab Director: Mina Marble [place], Phone:  [phone]  Microscopic Examination     Status: Abnormal   Collection Time: 10/30/22 10:36 AM   Urine  Result Value Ref Range   WBC, UA None seen 0 - 5 /hpf   RBC, Urine 3-10 (A) 0 - 2 /hpf   Epithelial Cells (non renal) None seen 0 - 10 /hpf   Bacteria, UA None seen None seen/Few   Narrative   Performed at:  [facility], [place], [place]  [id number] Lab Director: [place], Phone:  [phone]    BMET No results for input(s): "NA", "K", "CL", "CO2", "GLUCOSE", "BUN", "CREATININE", "CALCIUM" in the last 72 hours. PT/INR No results for input(s): "LABPROT", "INR" in the last 72 hours. ABG No results for input(s): "PHART", "HCO3" in the last 72 hours.  Invalid input(s): "PCO2", "PO2"  Studies/Results: No results found. Records from Dr. Jimmye Norman reviewed.  I will request additional labs.   I reviewed his CT from 2015 and he had a 52m left proximal Stone.   Assessment/Plan: Microhematuria with a prior history of stones in a smoker.  I will get him set up for a CT hematuria study and cystoscopy.  BUN/Cr ordered.   Screening for prostate cancer.  Exam small and benign.  PSA ordered.   No orders of the defined types were placed in this encounter.    Orders Placed This Encounter  Procedures   Microscopic Examination   CT HEMATURIA WORKUP    Standing Status:   Future    Standing Expiration Date:   04/30/2023    Order  Specific Question:   Reason for Exam (SYMPTOM  OR DIAGNOSIS REQUIRED)    Answer:   microhematuria    Order Specific Question:   [facility]    [facility]   Order Specific Question:  Radiology Contrast Protocol - do NOT remove file path    Answer:   [email or web address]   Urinalysis, Routine w reflex microscopic   BUN+Creat   PSA, total and free     Return for Next available with CT results for cystoscopy. .    CC: Dr. Stana Bunting.      Irine Seal 10/30/2022

## 2022-10-31 ENCOUNTER — Telehealth: Payer: Self-pay

## 2022-10-31 LAB — BUN+CREAT
BUN/Creatinine Ratio: 20 (ref 10–24)
BUN: 19 mg/dL (ref 8–27)
Creatinine, Ser: 0.94 mg/dL (ref 0.76–1.27)
eGFR: 88 mL/min/{1.73_m2} (ref >59)

## 2022-10-31 LAB — PSA, TOTAL AND FREE
PSA, Free Pct: 53.3 %
PSA, Free: 0.48 ng/mL
Prostate Specific Ag, Serum: 0.9 ng/mL (ref 0.0–4.0)

## 2022-10-31 NOTE — Telephone Encounter (Signed)
Made patient aware his kidney function and his PSA are normal. Patient voiced understanding.

## 2022-10-31 NOTE — Telephone Encounter (Signed)
-----   Message from Irine Seal, MD sent at 10/31/2022 12:47 PM EST ----- His kidney function and the PSA tests are normal.  ----- Message ----- From: Sherrilyn Rist, CMA Sent: 10/31/2022  12:38 PM EST To: Irine Seal, MD  Please review

## 2022-11-17 ENCOUNTER — Ambulatory Visit (HOSPITAL_COMMUNITY)
Admission: RE | Admit: 2022-11-17 | Discharge: 2022-11-17 | Disposition: A | Discharge status: Home / Self Care | Payer: Medicare Other | Source: Ambulatory Visit | Attending: Urology | Admitting: Urology

## 2022-11-17 ENCOUNTER — Encounter (HOSPITAL_COMMUNITY): Payer: Self-pay

## 2022-11-17 DIAGNOSIS — R3129 Other microscopic hematuria: Secondary | ICD-10-CM | POA: Diagnosis not present

## 2022-11-17 MED ORDER — SODIUM CHLORIDE 0.9 % IV SOLN
INTRAVENOUS | Status: AC
  Filled 2022-11-17: qty 250

## 2022-11-17 MED ORDER — IOHEXOL 300 MG/ML  SOLN
100.0000 mL | Freq: Once | INTRAMUSCULAR | Status: AC | PRN
  Administered 2022-11-17: 100 mL via INTRAVENOUS

## 2022-11-17 MED ORDER — SODIUM CHLORIDE (PF) 0.9 % IJ SOLN
INTRAMUSCULAR | Status: AC
  Filled 2022-11-17: qty 50

## 2022-11-18 ENCOUNTER — Telehealth: Payer: Self-pay

## 2022-11-18 NOTE — Telephone Encounter (Signed)
-----  Message from Irine Seal, MD sent at 11/17/2022  5:13 PM EST ----- There is a benign appearing cyst on one kidney but no other concerning findings.  He should f/u as planned for cystoscopy.  ----- Message ----- From: Sherrilyn Rist, CMA Sent: 11/17/2022   1:02 PM EST To: Irine Seal, MD  Please review

## 2022-11-18 NOTE — Telephone Encounter (Signed)
Made patient aware that there is a benign appearing cyst on one kidney but no other concerning findings. He should f/u as planned for cystoscopy. Patient voiced understanding

## 2022-11-27 ENCOUNTER — Ambulatory Visit: Payer: Medicare Other | Admitting: Urology

## 2022-11-27 ENCOUNTER — Encounter: Payer: Self-pay | Admitting: Urology

## 2022-11-27 VITALS — BP 117/67 | HR 71

## 2022-11-27 DIAGNOSIS — Z87442 Personal history of urinary calculi: Secondary | ICD-10-CM

## 2022-11-27 DIAGNOSIS — R3129 Other microscopic hematuria: Secondary | ICD-10-CM

## 2022-11-27 DIAGNOSIS — N281 Cyst of kidney, acquired: Secondary | ICD-10-CM

## 2022-11-27 DIAGNOSIS — Z125 Encounter for screening for malignant neoplasm of prostate: Secondary | ICD-10-CM

## 2022-11-27 MED ORDER — CIPROFLOXACIN HCL 500 MG PO TABS
500.0000 mg | ORAL_TABLET | Freq: Once | ORAL | Status: AC
  Administered 2022-11-27: 500 mg via ORAL

## 2022-11-27 NOTE — Progress Notes (Signed)
Subjective: 1. Microhematuria   2. History of nephrolithiasis   3. Screening for prostate cancer   4. Renal cyst      Consult requested by Dr. Stana Bunting  10/30/22: Lonnie Wade is a 67 yo WM who is sent for microhematuria on UA's over the last 3-4 months.  He has not had any gross hematuria or prior issues.  He had a stone on 12/10/2013 that he passed.  He has had rare UTI's but no GU surgery.  He has some intermittent left lower rib pain that is associated with sitting.   He has some dysuria 1-2x a months.  He was treated for a UTI and yeast infection.  He is circumcised.   He has no nocturia, urgency or frequency.  He can have a split stream at times.  UA today has 2+ blood.   11/27/22: Lonnie Wade returns today in f/u for his history of microhematuria.  The CT showed a minimally complex left renal cyst and some prostate enlargement but was otherwise negative.  His PSA was 0.9 on 10/30/22.  His UA has 3-10 RBC's.   Cr was 0.94.  ROS:  Review of Systems  All other systems reviewed and are negative.   No Known Allergies  Past Medical History:  Diagnosis Date   Tubular adenoma of colon     Past Surgical History:  Procedure Laterality Date   COLONOSCOPY      Social History   Socioeconomic History   Marital status: Single    Spouse name: Not on file   Number of children: Not on file   Years of education: Not on file   Highest education level: Not on file  Occupational History   Not on file  Tobacco Use   Smoking status: Former    Types: Cigarettes    Quit date: 07/27/2009    Years since quitting: 13.3   Smokeless tobacco: Never  Vaping Use   Vaping Use: Never used  Substance and Sexual Activity   Alcohol use: Never   Drug use: Never   Sexual activity: Not on file  Other Topics Concern   Not on file  Social History Narrative   Not on file   Social Determinants of Health   Financial Resource Strain: Not on file  Food Insecurity: Not on file  Transportation Needs: Not on file   Physical Activity: Not on file  Stress: Not on file  Social Connections: Not on file  Intimate Partner Violence: Not on file    Family History  Problem Relation Age of Onset   Hypertension Father    Diabetes Father     Anti-infectives: Anti-infectives (From admission, onward)    Start     Dose/Rate Route Frequency Ordered Stop   11/27/22 1445  CIPROFLOXACIN HCL 500 MG PO TABS        500 mg Oral  Once 11/27/22 1444 11/27/22 1532       Current Outpatient Medications  Medication Sig Dispense Refill   Cholecalciferol (VITAMIN D3) 25 MCG (1000 UT) CAPS Take by mouth daily.     levothyroxine (SYNTHROID) 88 MCG tablet Take 1 tablet (88 mcg total) by mouth daily before breakfast. 90 tablet 1   rosuvastatin (CRESTOR) 10 MG tablet Take 10 mg by mouth daily.     No current facility-administered medications for this visit.     Objective: Vital signs in last 24 hours: BP 117/67   Pulse 71   Intake/Output from previous day: No intake/output data recorded. Intake/Output this shift: @  IOTHISSHIFT@   Physical Exam  Lab Results:  Results for orders placed or performed in visit on 11/27/22 (from the past 24 hour(s))  Urinalysis, Routine w reflex microscopic     Status: Abnormal   Collection Time: 11/27/22  2:48 PM  Result Value Ref Range   Specific Gravity, UA 1.030 1.005 - 1.030   pH, UA 5.5 5.0 - 7.5   Color, UA Yellow Yellow   Appearance Ur Clear Clear   Leukocytes,UA Negative Negative   Protein,UA Negative Negative/Trace   Glucose, UA Negative Negative   Ketones, UA Negative Negative   RBC, UA 1+ (A) Negative   Bilirubin, UA Negative Negative   Urobilinogen, Ur 0.2 0.2 - 1.0 mg/dL   Nitrite, UA Negative Negative   Microscopic Examination See below:    Narrative   Performed at:  [facility] [address] [place], [place]  [id number] Lab Director: Mina Marble [place], Phone:  [phone]  Microscopic Examination     Status: Abnormal   Collection Time:  11/27/22  2:48 PM   Urine  Result Value Ref Range   WBC, UA 0-5 0 - 5 /hpf   RBC, Urine 3-10 (A) 0 - 2 /hpf   Epithelial Cells (non renal) 0-10 0 - 10 /hpf   Mucus, UA Present (A) Not Estab.   Bacteria, UA None seen None seen/Few   Narrative   Performed at:  [facility] [address], [place], [place]  [id number] Lab Director: [place], Phone:  [phone]     BMET No results for input(s): "NA", "K", "CL", "CO2", "GLUCOSE", "BUN", "CREATININE", "CALCIUM" in the last 72 hours. PT/INR No results for input(s): "LABPROT", "INR" in the last 72 hours. ABG No results for input(s): "PHART", "HCO3" in the last 72 hours.  Invalid input(s): "PCO2", "PO2"  Studies/Results: No results found. Records from Dr. Jimmye Norman reviewed.  I will request additional labs.   I reviewed his CT from 2015 and he had a 47m left proximal Stone.   CT HEMATURIA WORKUP  Result Date: 11/17/2022 CLINICAL DATA:  Microscopic hematuria. EXAM: CT ABDOMEN AND PELVIS WITHOUT AND WITH CONTRAST TECHNIQUE: Multidetector CT imaging of the abdomen and pelvis was performed following the standard protocol before and following the bolus administration of intravenous contrast. RADIATION DOSE REDUCTION: This exam was performed according to the departmental dose-optimization program which includes automated exposure control, adjustment of the mA and/or kV according to patient size and/or use of iterative reconstruction technique. CONTRAST:  1050mOMNIPAQUE IOHEXOL 300 MG/ML  SOLN COMPARISON:  Report from CT December 10, 2013 however no comparison imaging available at time dictation. FINDINGS: Lower chest: Linear band of scarring/atelectasis in the left lower lobe. Hepatobiliary: 5 mm hypodensity in the caudate lobe of the liver on image 18/4 is technically too small to accurately characterize but statistically likely to reflect a benign cyst or hemangioma. Gallbladder is unremarkable. No biliary ductal dilation.  Pancreas: No pancreatic ductal dilation or evidence of acute inflammation. Spleen: No splenomegaly. Adrenals/Urinary Tract: Bilateral adrenal glands appear normal. No hydronephrosis.  No renal, ureteral or bladder calculi. Exophytic 19 mm left upper pole renal lesion measures Hounsfield units of 26 pre contrast and 26 postcontrast administration compatible with a benign hemorrhagic/proteinaceous renal cyst which requires no independent imaging follow-up. No solid enhancing renal mass. Kidneys demonstrate symmetric enhancement and excretion of contrast material. No collecting system duplication. No suspicious filling defect identified within the opacified portions of the collecting systems or ureters on delayed imaging.  Urinary bladder is unremarkable for degree of distension. Stomach/Bowel: Stomach is unremarkable for degree of distension. No pathologic dilation of small or large bowel. Appendix and terminal ileum appear normal. No evidence of acute bowel inflammation. Vascular/Lymphatic: Aortic atherosclerosis. Smooth IVC contours. No pathologically enlarged abdominal or pelvic lymph nodes. Reproductive: Prostate is unremarkable. Other: No significant abdominopelvic free fluid. Musculoskeletal: No acute osseous abnormality. Multilevel degenerative changes spine. IMPRESSION: 1. No hydronephrosis. No renal, ureteral or bladder calculi. 2. No solid enhancing renal mass. 3. Exophytic 19 mm left upper pole renal lesion measures Hounsfield units of 26 pre contrast and 26 postcontrast administration compatible with a benign hemorrhagic/proteinaceous renal cyst which requires no independent imaging follow-up. 4. 5 mm hypodensity in the caudate lobe of the liver is technically too small to accurately characterize but statistically likely to reflect a benign cyst or hemangioma. 5.  Aortic Atherosclerosis (ICD10-I70.0). Electronically Signed   By: Dahlia Bailiff M.D.   On: 11/17/2022 11:54     Procedure: Cystoscopy.  He  was prepped with betadine and 2% lidocaine jelly and given cipro '500mg'$  po .  The cystoscope was passed and he was noted to have a normal urethra.  The external sphincter is intact.  The prostate is about 3cm with bilobar hyperplasia with a more prominent nodule in the right mid prostate.  The bladder has mild trabeculation without tumors, stones or inflammation.  The UO's are normal.   Assessment/Plan: Microhematuria with a prior history of stones in a smoker.  CT and cystoscopy show no clear cause.  Renal cyst: He has a minimally complex left renal cyst on CT.  Screening for prostate cancer.  Exam small and benign.  PSA 0.9.  Repeat in a year at f/u.    Meds ordered this encounter  Medications   ciprofloxacin (CIPRO) tablet 500 mg      Orders Placed This Encounter  Procedures   Microscopic Examination   Urinalysis, Routine w reflex microscopic   PSA, total and free    Standing Status:   Future    Standing Expiration Date:   11/28/2023     Return in about 1 year (around 11/28/2023).    CC: Dr. Stana Bunting.      Irine Seal 11/28/2022

## 2022-11-28 LAB — URINALYSIS, ROUTINE W REFLEX MICROSCOPIC
Bilirubin, UA: NEGATIVE
Glucose, UA: NEGATIVE
Ketones, UA: NEGATIVE
Leukocytes,UA: NEGATIVE
Nitrite, UA: NEGATIVE
Protein,UA: NEGATIVE
Specific Gravity, UA: 1.03 (ref 1.005–1.030)
Urobilinogen, Ur: 0.2 mg/dL (ref 0.2–1.0)
pH, UA: 5.5 (ref 5.0–7.5)

## 2022-11-28 LAB — MICROSCOPIC EXAMINATION: Bacteria, UA: NONE SEEN

## 2023-01-20 LAB — TSH: TSH: 2.12 (ref 0.41–5.90)

## 2023-01-21 ENCOUNTER — Encounter: Payer: Self-pay | Admitting: "Endocrinology

## 2023-01-27 ENCOUNTER — Encounter: Payer: Self-pay | Admitting: Nurse Practitioner

## 2023-01-27 ENCOUNTER — Ambulatory Visit: Payer: Medicare Other | Admitting: Nurse Practitioner

## 2023-01-27 VITALS — BP 112/69 | HR 83 | Ht 66.0 in | Wt 173.0 lb

## 2023-01-27 DIAGNOSIS — E063 Autoimmune thyroiditis: Secondary | ICD-10-CM | POA: Diagnosis not present

## 2023-01-27 DIAGNOSIS — E038 Other specified hypothyroidism: Secondary | ICD-10-CM

## 2023-01-27 MED ORDER — LEVOTHYROXINE SODIUM 88 MCG PO TABS: 88.0000 ug | ORAL_TABLET | Freq: Every day | ORAL | 1 refills | Status: DC

## 2023-01-27 NOTE — Progress Notes (Signed)
Endocrinology Follow Up Note                                         01/27/2023, 8:37 AM  Subjective:   Subjective    Lonnie Wade is a 67 y.o.-year-old male patient being seen in follow up after being seen in consultation for hypothyroidism referred by [place], MD.   Past Medical History:  Diagnosis Date   Tubular adenoma of colon     Past Surgical History:  Procedure Laterality Date   COLONOSCOPY      Social History   Socioeconomic History   Marital status: Single    Spouse name: Not on file   Number of children: Not on file   Years of education: Not on file   Highest education level: Not on file  Occupational History   Not on file  Tobacco Use   Smoking status: Former    Types: Cigarettes    Quit date: 07/27/2009    Years since quitting: 13.5   Smokeless tobacco: Never  Vaping Use   Vaping Use: Never used  Substance and Sexual Activity   Alcohol use: Never   Drug use: Never   Sexual activity: Not on file  Other Topics Concern   Not on file  Social History Narrative   Not on file   Social Determinants of Health   Financial Resource Strain: Not on file  Food Insecurity: Not on file  Transportation Needs: Not on file  Physical Activity: Not on file  Stress: Not on file  Social Connections: Not on file    Family History  Problem Relation Age of Onset   Hypertension Father    Diabetes Father     Outpatient Encounter Medications as of 01/27/2023  Medication Sig   Cholecalciferol (VITAMIN D3) 25 MCG (1000 UT) CAPS Take by mouth daily.   rosuvastatin (CRESTOR) 10 MG tablet Take 10 mg by mouth daily.   [DISCONTINUED] levothyroxine (SYNTHROID) 88 MCG tablet Take 1 tablet (88 mcg total) by mouth daily before breakfast.   levothyroxine (SYNTHROID) 88 MCG tablet Take 1 tablet (88 mcg total) by mouth daily before breakfast.   No facility-administered encounter medications on file as  of 01/27/2023.    ALLERGIES: No Known Allergies VACCINATION STATUS:  There is no immunization history on file for this patient.   HPI   Lonnie Wade  is a patient with the above medical history. he was diagnosed  with hypothyroidism at approximate age of 31 years (recent diagnosis after routine labs showed abnormal TSH), which required subsequent initiation of thyroid hormone replacement. he was given various doses of Levothyroxine, currently on 88 micrograms. he reports compliance to this medication:  Taking it daily on empty stomach with water, separated by >30 minutes before breakfast and other medications, and by at least 4 hours from calcium, iron, PPIs, multivitamins .  I reviewed patient's thyroid tests:  Lab Results  Component Value Date   TSH 2.12 01/20/2023   TSH 4.17 07/02/2022  TSH 46.10 (A) 03/26/2022     Pt denies feeling nodules in neck, hoarseness, dysphagia/odynophagia, SOB with lying down.  he does have family history of thyroid disorders in his daughter and wife.  No family history of thyroid cancer. No history of radiation therapy to head or neck.  No recent use of iodine supplements.  Denies use of Biotin containing supplements.  He had thyroid US on 04/10/22 which showed 26mm nodule in left thyroid lobe, not suspicious for malignancy, not recommending follow up.   Review of systems  Constitutional: + Minimally fluctuating body weight,  current Body mass index is 27.92 kg/m. , no fatigue, no subjective hyperthermia, no subjective hypothermia Eyes: no blurry vision, no xerophthalmia ENT: no sore throat, no nodules palpated in throat, no dysphagia/odynophagia, no hoarseness Cardiovascular: no chest pain, no shortness of breath, no palpitations, no leg swelling Respiratory: no cough, no shortness of breath Gastrointestinal: no nausea/vomiting/diarrhea Musculoskeletal: no muscle/joint aches Skin: no rashes, no hyperemia Neurological: no tremors, no numbness, no  tingling, no dizziness Psychiatric: no depression, no anxiety   Objective:   Objective     BP 112/69 (BP Location: Left Arm, Patient Position: Sitting, Cuff Size: Large)   Pulse 83   Ht 5\' 6"  (1.676 m)   Wt 173 lb (78.5 kg)   BMI 27.92 kg/m  Wt Readings from Last 3 Encounters:  01/27/23 173 lb (78.5 kg)  10/07/22 173 lb (78.5 kg)  07/07/22 172 lb 3.2 oz (78.1 kg)    BP Readings from Last 3 Encounters:  01/27/23 112/69  11/27/22 117/67  10/30/22 123/73      Physical Exam- Limited  Constitutional:  Body mass index is 27.92 kg/m. , not in acute distress, normal state of mind Eyes:  EOMI, no exophthalmos Musculoskeletal: no gross deformities, strength intact in all four extremities, no gross restriction of joint movements Skin:  no rashes, no hyperemia Neurological: no tremor with outstretched hands   CMP ( most recent) CMP     Component Value Date/Time   BUN 19 10/30/2022 1058   CREATININE 0.94 10/30/2022 1058     Diabetic Labs (most recent): Lab Results  Component Value Date   HGBA1C 5.8 03/26/2022     Lipid Panel ( most recent) Lipid Panel     Component Value Date/Time   TRIG 132 03/26/2022 0000   LDLCALC 77 03/26/2022 0000       Lab Results  Component Value Date   TSH 2.12 01/20/2023   TSH 4.17 07/02/2022   TSH 46.10 (A) 03/26/2022    Thyroid US from 04/10/22 Clinical history: elevated TSH  Findings: Right lobe: measures 6 x 2.1 x 2.1 cm.  Heterogeneous echotexture. Normal flow.  No nodules. Left lobe: Measures 5.9 x 2.7 x 3 cm.  Heterogeneous echotexture.  Normal Flow.  5 x 6 x 4 mm hyperechoic nodule TR3. Isthmus: 41mm- no nodules No adenopathy Comparison: None  Impression:  6 mm left lobe nodule, TR3, no follow up needed No other nodules.     Resulted: 01/20/23 0000  Result status: Final  Resulting lab: [facility]  Reference range: 0.41 - 5.90  Value: 2.12  Comment: FREE T4 1.42    Assessment & Plan:   ASSESSMENT /  PLAN:  1. Hypothyroidism-r/t Hashimotos thyroiditis  Patient with relatively new hypothyroidism, on levothyroxine therapy. 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  His previsit TFTs are consistent with appropriate hormone replacement.  He is advised to continue his Levothyroxine 88  mcg po daily before breakfast.  - We discussed about correct intake of levothyroxine, at fasting, with water, separated by at least 30 minutes from breakfast, and separated by more than 4 hours from calcium, iron, multivitamins, acid reflux medications (PPIs). -Patient is made aware of the fact that thyroid hormone replacement is needed for life, dose to be adjusted by periodic monitoring of thyroid function tests.     I spent  15  minutes in the care of the patient today including review of labs from Thyroid Function, CMP, and other relevant labs ; imaging/biopsy records (current and previous including abstractions from other facilities); face-to-face time discussing  his lab results and symptoms, medications doses, his options of short and long term treatment based on the latest standards of care / guidelines;   and documenting the encounter.  [employer]  participated in the discussions, expressed understanding, and voiced agreement with the above plans.  All questions were answered to his satisfaction. he is encouraged to contact clinic should he have any questions or concerns prior to his return visit.   FOLLOW UP PLAN:  Return in about 4 months (around 05/29/2023) for Thyroid follow up, Previsit labs.  Rayetta Pigg, [facility] [address] [place], [place] [address] Phone: [phone] Fax: [phone]  01/27/2023, 8:37 AM

## 2023-01-27 NOTE — Patient Instructions (Signed)

## 2023-02-06 ENCOUNTER — Ambulatory Visit: Payer: 59 | Admitting: Nurse Practitioner

## 2023-05-27 NOTE — Patient Instructions (Signed)

## 2023-05-29 ENCOUNTER — Ambulatory Visit: Payer: Medicare Other | Admitting: Nurse Practitioner

## 2023-05-29 ENCOUNTER — Encounter: Payer: Self-pay | Admitting: Nurse Practitioner

## 2023-05-29 VITALS — BP 108/69 | HR 87 | Ht 66.0 in | Wt 173.6 lb

## 2023-05-29 DIAGNOSIS — E063 Autoimmune thyroiditis: Secondary | ICD-10-CM

## 2023-05-29 DIAGNOSIS — E038 Other specified hypothyroidism: Secondary | ICD-10-CM | POA: Diagnosis not present

## 2023-05-29 DIAGNOSIS — E041 Nontoxic single thyroid nodule: Secondary | ICD-10-CM

## 2023-05-29 MED ORDER — LEVOTHYROXINE SODIUM 88 MCG PO TABS: 88.0000 ug | ORAL_TABLET | Freq: Every day | ORAL | 1 refills | Status: DC

## 2023-05-29 NOTE — Progress Notes (Signed)
Endocrinology Follow Up Note                                         05/29/2023, 8:12 AM  Subjective:   Subjective    Lonnie Wade is a 67 y.o.-year-old male patient being seen in follow up after being seen in consultation for hypothyroidism referred by Donetta Potts, MD.   Past Medical History:  Diagnosis Date   Tubular adenoma of colon     Past Surgical History:  Procedure Laterality Date   COLONOSCOPY      Social History   Socioeconomic History   Marital status: Single    Spouse name: Not on file   Number of children: Not on file   Years of education: Not on file   Highest education level: Not on file  Occupational History   Not on file  Tobacco Use   Smoking status: Former    Current packs/day: 0.00    Types: Cigarettes    Quit date: 07/27/2009    Years since quitting: 13.8   Smokeless tobacco: Never  Vaping Use   Vaping status: Never Used  Substance and Sexual Activity   Alcohol use: Never   Drug use: Never   Sexual activity: Not on file  Other Topics Concern   Not on file  Social History Narrative   Not on file   Social Determinants of Health   Financial Resource Strain: Not on file  Food Insecurity: Not on file  Transportation Needs: Not on file  Physical Activity: Not on file  Stress: Not on file  Social Connections: Not on file    Family History  Problem Relation Age of Onset   Hypertension Father    Diabetes Father     Outpatient Encounter Medications as of 05/29/2023  Medication Sig   Cholecalciferol (VITAMIN D3) 25 MCG (1000 UT) CAPS Take by mouth daily.   predniSONE (DELTASONE) 20 MG tablet Take 40 mg by mouth daily.   rosuvastatin (CRESTOR) 10 MG tablet Take 10 mg by mouth daily.   valACYclovir (VALTREX) 1000 MG tablet Take 1,000 mg by mouth 3 (three) times daily.   [DISCONTINUED] levothyroxine (SYNTHROID) 88 MCG tablet Take 1 tablet (88 mcg total) by mouth daily  before breakfast.   levothyroxine (SYNTHROID) 88 MCG tablet Take 1 tablet (88 mcg total) by mouth daily before breakfast.   No facility-administered encounter medications on file as of 05/29/2023.    ALLERGIES: No Known Allergies VACCINATION STATUS:  There is no immunization history on file for this patient.   HPI   Lonnie Wade  is a patient with the above medical history. he was diagnosed  with hypothyroidism at approximate age of 68 years (recent diagnosis after routine labs showed abnormal TSH), which required subsequent initiation of thyroid hormone replacement. he was given various doses of Levothyroxine, currently on 88 micrograms. he reports compliance to this medication:  Taking it daily on empty stomach with water, separated by >30 minutes before breakfast and other medications,  and by at least 4 hours from calcium, iron, PPIs, multivitamins .  I reviewed patient's thyroid tests:  Lab Results  Component Value Date   TSH 2.12 01/20/2023   TSH 4.17 07/02/2022   TSH 46.10 (A) 03/26/2022     Pt denies feeling nodules in neck, hoarseness, dysphagia/odynophagia, SOB with lying down.  he does have family history of thyroid disorders in his daughter and wife.  No family history of thyroid cancer. No history of radiation therapy to head or neck.  No recent use of iodine supplements.  Denies use of Biotin containing supplements.  He had thyroid US on 04/10/22 which showed 6mm nodule in left thyroid lobe, not suspicious for malignancy, not recommending follow up.   Review of systems  Constitutional: + Minimally fluctuating body weight,  current Body mass index is 28.02 kg/m. , no fatigue, no subjective hyperthermia, no subjective hypothermia Eyes: no blurry vision, no xerophthalmia ENT: no sore throat, no nodules palpated in throat, no dysphagia/odynophagia, no hoarseness Cardiovascular: no chest pain, no shortness of breath, no palpitations, no leg swelling Respiratory: no cough,  no shortness of breath Gastrointestinal: no nausea/vomiting/diarrhea Musculoskeletal: no muscle/joint aches Skin: no rashes, no hyperemia Neurological: no tremors, no numbness, no tingling, no dizziness Psychiatric: no depression, no anxiety   Objective:   Objective     BP 108/69 (BP Location: Left Arm, Patient Position: Sitting, Cuff Size: Large)   Pulse 87   Ht 5\' 6"  (1.676 m)   Wt 173 lb 9.6 oz (78.7 kg)   BMI 28.02 kg/m  Wt Readings from Last 3 Encounters:  05/29/23 173 lb 9.6 oz (78.7 kg)  01/27/23 173 lb (78.5 kg)  10/07/22 173 lb (78.5 kg)    BP Readings from Last 3 Encounters:  05/29/23 108/69  01/27/23 112/69  11/27/22 117/67      Physical Exam- Limited  Constitutional:  Body mass index is 28.02 kg/m. , not in acute distress, normal state of mind Eyes:  EOMI, no exophthalmos Musculoskeletal: no gross deformities, strength intact in all four extremities, no gross restriction of joint movements Skin:  no rashes, no hyperemia Neurological: no tremor with outstretched hands   CMP ( most recent) CMP     Component Value Date/Time   BUN 19 10/30/2022 1058   CREATININE 0.94 10/30/2022 1058     Diabetic Labs (most recent): Lab Results  Component Value Date   HGBA1C 5.8 03/26/2022     Lipid Panel ( most recent) Lipid Panel     Component Value Date/Time   TRIG 132 03/26/2022 0000   LDLCALC 77 03/26/2022 0000       Lab Results  Component Value Date   TSH 2.12 01/20/2023   TSH 4.17 07/02/2022   TSH 46.10 (A) 03/26/2022    Thyroid US from 04/10/22 Clinical history: elevated TSH  Findings: Right lobe: measures 6 x 2.1 x 2.1 cm.  Heterogeneous echotexture. Normal flow.  No nodules. Left lobe: Measures 5.9 x 2.7 x 3 cm.  Heterogeneous echotexture.  Normal Flow.  5 x 6 x 4 mm hyperechoic nodule TR3. Isthmus: 4mm- no nodules No adenopathy Comparison: None  Impression:  6 mm left lobe nodule, TR3, no follow up needed No other  nodules.     Resulted: 01/20/23 0000  Result status: Final  Resulting lab: [facility]  Reference range: 0.41 - 5.90  Value: 2.12  Comment: FREE T4 1.42       Assessment & Plan:   ASSESSMENT / PLAN:  1. Hypothyroidism-r/t Hashimotos  thyroiditis  Patient with relatively new hypothyroidism, on levothyroxine therapy. 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  His previsit TFTs are consistent with appropriate hormone replacement.  He is advised to continue his Levothyroxine 88 mcg po daily before breakfast.  If labs still stable at the next follow up, will change to annual evaluation.  - We discussed about correct intake of levothyroxine, at fasting, with water, separated by at least 30 minutes from breakfast, and separated by more than 4 hours from calcium, iron, multivitamins, acid reflux medications (PPIs). -Patient is made aware of the fact that thyroid hormone replacement is needed for life, dose to be adjusted by periodic monitoring of thyroid function tests.    I spent  18  minutes in the care of the patient today including review of labs from Thyroid Function, CMP, and other relevant labs ; imaging/biopsy records (current and previous including abstractions from other facilities); face-to-face time discussing  his lab results and symptoms, medications doses, his options of short and long term treatment based on the latest standards of care / guidelines;   and documenting the encounter.  Edyth Gunnels Baysinger  participated in the discussions, expressed understanding, and voiced agreement with the above plans.  All questions were answered to his satisfaction. he is encouraged to contact clinic should he have any questions or concerns prior to his return visit.   FOLLOW UP PLAN:  Return in about 6 months (around 11/29/2023) for Thyroid follow up, Previsit labs.  Ronny Bacon, [facility] [address] [place], [place] [address] Phone: [phone] Fax: [phone]  05/29/2023, 8:12 AM

## 2023-09-27 ENCOUNTER — Other Ambulatory Visit: Payer: Self-pay | Admitting: "Endocrinology

## 2023-11-23 LAB — TSH: TSH: 2.77 (ref 0.41–5.90)

## 2023-11-26 ENCOUNTER — Other Ambulatory Visit: Payer: Medicare Other

## 2023-11-27 NOTE — Patient Instructions (Incomplete)

## 2023-11-30 ENCOUNTER — Ambulatory Visit: Payer: Medicare Other | Admitting: Nurse Practitioner

## 2023-11-30 DIAGNOSIS — E063 Autoimmune thyroiditis: Secondary | ICD-10-CM

## 2023-12-03 ENCOUNTER — Ambulatory Visit: Payer: Medicare Other | Admitting: Urology

## 2023-12-04 NOTE — Patient Instructions (Signed)

## 2023-12-07 ENCOUNTER — Encounter: Payer: Self-pay | Admitting: Nurse Practitioner

## 2023-12-07 ENCOUNTER — Ambulatory Visit: Payer: Medicare Other | Admitting: Nurse Practitioner

## 2023-12-07 VITALS — BP 110/64 | HR 74 | Wt 173.8 lb

## 2023-12-07 DIAGNOSIS — E041 Nontoxic single thyroid nodule: Secondary | ICD-10-CM

## 2023-12-07 DIAGNOSIS — E063 Autoimmune thyroiditis: Secondary | ICD-10-CM | POA: Diagnosis not present

## 2023-12-07 MED ORDER — LEVOTHYROXINE SODIUM 88 MCG PO TABS: 88.0000 ug | ORAL_TABLET | Freq: Every day | ORAL | 3 refills | Status: AC

## 2023-12-07 NOTE — Progress Notes (Signed)
 Endocrinology Follow Up Note                                         12/07/2023, 1:10 PM  Subjective:   Subjective    Lonnie Wade is a 68 y.o.-year-old male patient being seen in follow up after being seen in consultation for hypothyroidism referred by Lonnie Pollard, MD.   Past Medical History:  Diagnosis Date   Tubular adenoma of colon     Past Surgical History:  Procedure Laterality Date   COLONOSCOPY      Social History   Socioeconomic History   Marital status: Single    Spouse name: Not on file   Number of children: Not on file   Years of education: Not on file   Highest education level: Not on file  Occupational History   Not on file  Tobacco Use   Smoking status: Former    Current packs/day: 0.00    Types: Cigarettes    Quit date: 07/27/2009    Years since quitting: 14.3   Smokeless tobacco: Never  Vaping Use   Vaping status: Never Used  Substance and Sexual Activity   Alcohol use: Never   Drug use: Never   Sexual activity: Not on file  Other Topics Concern   Not on file  Social History Narrative   Not on file   Social Drivers of Health   Financial Resource Strain: Not on file  Food Insecurity: Not on file  Transportation Needs: Not on file  Physical Activity: Not on file  Stress: Not on file  Social Connections: Not on file    Family History  Problem Relation Age of Onset   Hypertension Father    Diabetes Father     Outpatient Encounter Medications as of 12/07/2023  Medication Sig   Cholecalciferol (VITAMIN D3) 25 MCG (1000 UT) CAPS Take by mouth daily.   rosuvastatin (CRESTOR) 10 MG tablet Take 10 mg by mouth daily.   [DISCONTINUED] levothyroxine  (SYNTHROID ) 88 MCG tablet TAKE 1 TABLET BY MOUTH DAILY   levothyroxine  (SYNTHROID ) 88 MCG tablet Take 1 tablet (88 mcg total) by mouth daily.   predniSONE (DELTASONE) 20 MG tablet Take 40 mg by mouth daily. (Patient not  taking: Reported on 12/07/2023)   valACYclovir (VALTREX) 1000 MG tablet Take 1,000 mg by mouth 3 (three) times daily. (Patient not taking: Reported on 12/07/2023)   No facility-administered encounter medications on file as of 12/07/2023.    ALLERGIES: No Known Allergies VACCINATION STATUS:  There is no immunization history on file for this patient.   HPI   Lonnie Wade  is a patient with the above medical history. he was diagnosed  with hypothyroidism at approximate age of 31 years (recent diagnosis after routine labs showed abnormal TSH), which required subsequent initiation of thyroid hormone replacement. he was given various doses of Levothyroxine , currently on 88 micrograms. he reports compliance to this medication:  Taking it daily on empty stomach with water, separated by >30 minutes  before breakfast and other medications, and by at least 4 hours from calcium, iron, PPIs, multivitamins .  I reviewed patient's thyroid tests:  Lab Results  Component Value Date   TSH 2.77 11/23/2023   TSH 2.12 01/20/2023   TSH 4.17 07/02/2022   TSH 46.10 (A) 03/26/2022     Pt denies feeling nodules in neck, hoarseness, dysphagia/odynophagia, SOB with lying down.  he does have family history of thyroid disorders in his daughter and wife.  No family history of thyroid cancer. No history of radiation therapy to head or neck.  No recent use of iodine supplements.  Denies use of Biotin containing supplements.  He had thyroid US  on 04/10/22 which showed 6mm nodule in left thyroid lobe, not suspicious for malignancy, not recommending follow up.   Review of systems  Constitutional: + Minimally fluctuating body weight,  current Body mass index is 28.05 kg/m. , no fatigue, no subjective hyperthermia, no subjective hypothermia Eyes: no blurry vision, no xerophthalmia ENT: no sore throat, no nodules palpated in throat, no dysphagia/odynophagia, no hoarseness Cardiovascular: no chest pain, no shortness of  breath, no palpitations, no leg swelling Respiratory: no cough, no shortness of breath Gastrointestinal: no nausea/vomiting/diarrhea Musculoskeletal: no muscle/joint aches Skin: no rashes, no hyperemia Neurological: no tremors, no numbness, no tingling, no dizziness Psychiatric: no depression, no anxiety   Objective:   Objective     BP 110/64 (BP Location: Left Arm, Patient Position: Sitting, Cuff Size: Large)   Pulse 74   Wt 173 lb 12.8 oz (78.8 kg)   BMI 28.05 kg/m  Wt Readings from Last 3 Encounters:  12/07/23 173 lb 12.8 oz (78.8 kg)  05/29/23 173 lb 9.6 oz (78.7 kg)  01/27/23 173 lb (78.5 kg)    BP Readings from Last 3 Encounters:  12/07/23 110/64  05/29/23 108/69  01/27/23 112/69      Physical Exam- Limited  Constitutional:  Body mass index is 28.05 kg/m. , not in acute distress, normal state of mind Eyes:  EOMI, no exophthalmos Musculoskeletal: no gross deformities, strength intact in all four extremities, no gross restriction of joint movements Skin:  no rashes, no hyperemia Neurological: no tremor with outstretched hands   CMP ( most recent) CMP     Component Value Date/Time   BUN 19 10/30/2022 1058   CREATININE 0.94 10/30/2022 1058     Diabetic Labs (most recent): Lab Results  Component Value Date   HGBA1C 5.8 03/26/2022     Lipid Panel ( most recent) Lipid Panel     Component Value Date/Time   TRIG 132 03/26/2022 0000   LDLCALC 77 03/26/2022 0000       Lab Results  Component Value Date   TSH 2.77 11/23/2023   TSH 2.12 01/20/2023   TSH 4.17 07/02/2022   TSH 46.10 (A) 03/26/2022    Thyroid US  from 04/10/22 Clinical history: elevated TSH  Findings: Right lobe: measures 6 x 2.1 x 2.1 cm.  Heterogeneous echotexture. Normal flow.  No nodules. Left lobe: Measures 5.9 x 2.7 x 3 cm.  Heterogeneous echotexture.  Normal Flow.  5 x 6 x 4 mm hyperechoic nodule TR3. Isthmus: 4mm- no nodules No adenopathy Comparison: None  Impression:  6  mm left lobe nodule, TR3, no follow up needed No other nodules.     Resulted: 01/20/23 0000  Result status: Final  Resulting lab: [facility]  Reference range: 0.41 - 5.90  Value: 2.12  Comment: FREE T4 1.42  Latest Reference Range & Units 03/26/22 00:00 07/02/22 00:00 01/20/23 00:00 11/23/23 00:00  TSH 0.41 - 5.90  46.10 ! (E) 4.17 (E) 2.12 (E) 2.77 (E)  !: Data is abnormal (E): External lab result  Assessment & Plan:   ASSESSMENT / PLAN:  1. Hypothyroidism-r/t Hashimotos thyroiditis  Patient with relatively new hypothyroidism, on levothyroxine  therapy. 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  His previsit TFTs are consistent with appropriate hormone replacement.  He is advised to continue his Levothyroxine  88 mcg po daily before breakfast.  - We discussed about correct intake of levothyroxine , at fasting, with water, separated by at least 30 minutes from breakfast, and separated by more than 4 hours from calcium, iron, multivitamins, acid reflux medications (PPIs). -Patient is made aware of the fact that thyroid hormone replacement is needed for life, dose to be adjusted by periodic monitoring of thyroid function tests.    I spent  10  minutes in the care of the patient today including review of labs from Thyroid Function, CMP, and other relevant labs ; imaging/biopsy records (current and previous including abstractions from other facilities); face-to-face time discussing  his lab results and symptoms, medications doses, his options of short and long term treatment based on the latest standards of care / guidelines;   and documenting the encounter.  Rodolfo Clan [employer]rick  participated in the discussions, expressed understanding, and voiced agreement with the above plans.  All questions were answered to his satisfaction. he is encouraged to contact clinic should he have any questions or concerns prior to his  return visit.   FOLLOW UP PLAN:  Return in about 1 year (around 12/06/2024) for Thyroid follow up, Previsit labs.  Hulon Magic, [facility] [address] [place], [place] [address] Phone: [phone] Fax: [phone]  12/07/2023, 1:10 PM

## 2023-12-17 ENCOUNTER — Ambulatory Visit: Payer: Medicare Other | Admitting: Urology

## 2024-11-14 ENCOUNTER — Telehealth: Payer: Self-pay | Admitting: Nurse Practitioner

## 2024-11-14 ENCOUNTER — Other Ambulatory Visit: Payer: Self-pay | Admitting: *Deleted

## 2024-11-14 DIAGNOSIS — E063 Autoimmune thyroiditis: Secondary | ICD-10-CM

## 2024-11-14 DIAGNOSIS — E041 Nontoxic single thyroid nodule: Secondary | ICD-10-CM

## 2024-11-14 NOTE — Telephone Encounter (Signed)
 Labs were placed.

## 2024-11-14 NOTE — Telephone Encounter (Signed)
 Pt needs labs updated

## 2024-11-26 LAB — T4, FREE: Free T4: 1.4 ng/dL (ref 0.82–1.77)

## 2024-11-26 LAB — TSH: TSH: 1.91 u[IU]/mL (ref 0.450–4.500)

## 2024-12-06 ENCOUNTER — Ambulatory Visit: Payer: Medicare Other | Admitting: Nurse Practitioner
